# Patient Record
Sex: Male | Born: 2000 | Race: White | Hispanic: No | Marital: Single
Health system: Southern US, Community
[De-identification: ages and names within clinical notes are randomized; demographics above are authoritative.]

---
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
## 2021-01-02 ENCOUNTER — Ambulatory Visit: Payer: Self-pay | Admitting: Dermatology

## 2021-01-11 ENCOUNTER — Encounter: Payer: Self-pay | Admitting: Dermatology

## 2021-01-11 ENCOUNTER — Other Ambulatory Visit: Payer: Self-pay

## 2021-01-11 ENCOUNTER — Ambulatory Visit: Payer: BC Managed Care – PPO | Admitting: Dermatology

## 2021-01-11 DIAGNOSIS — L700 Acne vulgaris: Secondary | ICD-10-CM

## 2021-01-11 NOTE — Progress Notes (Signed)
° °  Follow-Up Visit   Subjective  Tyler Stafford is a 20 y.o. male who presents for the following: Acne (Patient here today for worsening acne. Patient did a course of accutane and is currently only using clindamycin. ).  Patient discontinued isotretinoin due to persistent hip pain after injury in March, total was about 20 weeks of treatment.   The following portions of the chart were reviewed this encounter and updated as appropriate:   Allergies   Meds   Problems   Med Hx   Surg Hx   Fam Hx       Review of Systems:  No other skin or systemic complaints except as noted in HPI or Assessment and Plan.  Objective  Well appearing patient in no apparent distress; mood and affect are within normal limits.  A focused examination was performed including face, neck, chest and back. Relevant physical exam findings are noted in the Assessment and Plan.  face Face with trace open comedones, scattered inflammatory papules favoring cheeks and neck Chest with rare inflammatory papule Back with trace open comedones    Assessment & Plan  Acne vulgaris face  Chronic condition with duration over one year. Condition is bothersome to patient. Currently flared.  Previously on isotretinoin with good response but discontinued earlier in the year due to persistent hip pain after injury.   Pending labs will start Absorica 20 mg once daily  Reviewed potential side effects of isotretinoin including xerosis, cheilitis, hepatitis, hyperlipidemia, and severe birth defects if taken by a pregnant woman. Reviewed reports of suicidal ideation in those with a history of depression while taking isotretinoin and reports of diagnosis of inflammatory bowl disease while taking isotretinoin as well as the lack of evidence for a causal relationship between isotretinoin, depression and IBD. Patient advised to reach out with any questions or concerns. Patient advised not to share pills or donate blood while on treatment or for  one month after completing treatment.  [facility]  Related Procedures Lipid panel Hepatic Function Panel   Return in about 30 days (around 02/10/2021) for Isotretinoin.  Anise Salvo, RMA, am acting as scribe for Darden Dates, MD .  Documentation: I have reviewed the above documentation for accuracy and completeness, and I agree with the above.  Darden Dates, MD

## 2021-01-11 NOTE — Patient Instructions (Addendum)
Reviewed potential side effects of isotretinoin including xerosis, cheilitis, hepatitis, hyperlipidemia, and severe birth defects if taken by a pregnant woman. Reviewed reports of suicidal ideation in those with a history of depression while taking isotretinoin and reports of diagnosis of inflammatory bowl disease while taking isotretinoin as well as the lack of evidence for a causal relationship between isotretinoin, depression and IBD. Patient advised to reach out with any questions or concerns. Patient advised not to share pills or donate blood while on treatment or for one month after completing treatment.  Recommended non-comedogenic (non-acne causing) facial oils include 100% argan oil or squalane. The can be used after applying any recommended creams or ointments to the skin in the evening. [employer] has a high-quality and affordable version of both of these and can be found at [place].  If You Need Anything After Your Visit  If you have any questions or concerns for your doctor, please call our main line at [phone] and press option 4 to reach your doctor's medical assistant. If no one answers, please leave a voicemail as directed and we will return your call as soon as possible. Messages left after 4 pm will be answered the following business day.   You may also send [place] a message via MyChart. We typically respond to MyChart messages within 1-2 business days.  For prescription refills, please ask your pharmacy to contact our office. Our fax number is [phone].  If you have an urgent issue when the clinic is closed that cannot wait until the next business day, you can page your doctor at the number below.    Please note that while we do our best to be available for urgent issues outside of office hours, we are not available 24/7.   If you have an urgent issue and are unable to reach [place], you may choose to seek medical care at your doctor's office, retail clinic, urgent  care center, or emergency room.  If you have a medical emergency, please immediately call 911 or go to the emergency department.  Pager Numbers  - Dr. Gwen Pounds: [phone]  - Dr. Neale Burly: [phone]  - Dr. Roseanne Reno: [phone]  In the event of inclement weather, please call our main line at [phone] for an update on the status of any delays or closures.  Dermatology Medication Tips: Please keep the boxes that topical medications come in in order to help keep track of the instructions about where and how to use these. Pharmacies typically print the medication instructions only on the boxes and not directly on the medication tubes.   If your medication is too expensive, please contact our office at [phone] option 4 or send [place] a message through MyChart.   We are unable to tell what your co-pay for medications will be in advance as this is different depending on your insurance coverage. However, we may be able to find a substitute medication at lower cost or fill out paperwork to get insurance to cover a needed medication.   If a prior authorization is required to get your medication covered by your insurance company, please allow [place] 1-2 business days to complete this process.  Drug prices often vary depending on where the prescription is filled and some pharmacies may offer cheaper prices.  The website www.goodrx.com contains coupons for medications through different pharmacies. The prices here do not account for what the cost may be with help from insurance (it may be cheaper with your insurance), but the website  can give you the price if you did not use any insurance.  - You can print the associated coupon and take it with your prescription to the pharmacy.  - You may also stop by our office during regular business hours and pick up a GoodRx coupon card.  - If you need your prescription sent electronically to a different pharmacy, notify our office through [facility] or  by phone at [phone] option 4.     Si Usted Necesita Algo Despus de Su Visita  Tambin puede enviarnos un mensaje a travs de Pharmacist, community. Por lo general respondemos a los mensajes de MyChart en el transcurso de 1 a 2 das hbiles.  Para renovar recetas, por favor pida a su farmacia que se ponga en contacto con nuestra oficina. Harland Dingwall de fax es [place] [phone].  Si tiene un asunto urgente cuando la clnica est cerrada y que no puede esperar hasta el siguiente da hbil, puede llamar/localizar a su doctor(a) al nmero que aparece a continuacin.   Por favor, tenga en cuenta que aunque hacemos todo lo posible para estar disponibles para asuntos urgentes fuera del horario de [place], no estamos disponibles las 24 horas del da, los 7 das de la [place].   Si tiene un problema urgente y no puede comunicarse con nosotros, puede optar por buscar atencin mdica  en el consultorio de su doctor(a), en una clnica privada, en un centro de atencin urgente o en una sala de emergencias.  Si tiene Engineering geologist, por favor llame inmediatamente al 911 o vaya a la sala de emergencias.  Nmeros de bper  - Dr. Nehemiah Massed: [phone]  - Dra. Moye: [phone]  - Dra. Nicole Kindred: [phone]  En caso de inclemencias del [place], por favor llame a Johnsie Kindred principal al [phone] para una actualizacin sobre el [place] de cualquier retraso o cierre.  Consejos para la medicacin en dermatologa: Por favor, guarde las cajas en las que vienen los medicamentos de uso tpico para ayudarle a seguir las instrucciones sobre dnde y cmo usarlos. Las farmacias generalmente imprimen las instrucciones del medicamento slo en las cajas y no directamente en los tubos del [place].   Si su medicamento es muy caro, por favor, pngase en contacto con Zigmund Daniel llamando al [phone] y presione la opcin 4 o envenos un mensaje a travs de Pharmacist, community.   No podemos decirle cul ser su  copago por los medicamentos por adelantado ya que esto es diferente dependiendo de la cobertura de su seguro. Sin embargo, es posible que podamos encontrar un medicamento sustituto a Electrical engineer un formulario para que el seguro cubra el medicamento que se considera necesario.   Si se requiere una autorizacin previa para que su compaa de seguros [place] su medicamento, por favor permtanos de 1 a 2 das hbiles para completar este proceso.  Los precios de los medicamentos varan con frecuencia dependiendo del Environmental consultant de dnde se surte la receta y alguna farmacias pueden ofrecer precios ms baratos.  El sitio web www.goodrx.com tiene cupones para medicamentos de Airline pilot. Los precios aqu no tienen en cuenta lo que podra costar con la ayuda del seguro (puede ser ms barato con su seguro), pero el sitio web puede darle el precio si no utiliz Research scientist (physical sciences).  - Puede imprimir el cupn correspondiente y llevarlo con su receta a la farmacia.  - Tambin puede pasar por nuestra oficina durante el horario de atencin regular y Charity fundraiser una tarjeta de cupones de GoodRx.  -  Si necesita que su receta se enve electrnicamente a Chiropodist, informe a nuestra oficina a travs de MyChart de [place] o por telfono llamando al [phone] y presione la opcin 4.

## 2021-01-19 LAB — HEPATIC FUNCTION PANEL
ALT: 10 IU/L (ref 0–44)
AST: 22 IU/L (ref 0–40)
Albumin: 4.8 g/dL (ref 4.1–5.2)
Alkaline Phosphatase: 103 IU/L (ref 51–125)
Bilirubin Total: 0.6 mg/dL (ref 0.0–1.2)
Bilirubin, Direct: 0.15 mg/dL (ref 0.00–0.40)
Total Protein: 7.4 g/dL (ref 6.0–8.5)

## 2021-01-19 LAB — LIPID PANEL
Chol/HDL Ratio: 3.3 ratio (ref 0.0–5.0)
Cholesterol, Total: 124 mg/dL (ref 100–169)
HDL: 38 mg/dL — ABNORMAL LOW (ref >39)
LDL Chol Calc (NIH): 67 mg/dL (ref 0–109)
Triglycerides: 100 mg/dL — ABNORMAL HIGH (ref 0–89)
VLDL Cholesterol Cal: 19 mg/dL (ref 5–40)

## 2021-01-24 ENCOUNTER — Telehealth: Payer: Self-pay

## 2021-01-24 DIAGNOSIS — L700 Acne vulgaris: Secondary | ICD-10-CM

## 2021-01-24 MED ORDER — ISOTRETINOIN 20 MG PO CAPS: 20.0000 mg | ORAL_CAPSULE | Freq: Every day | ORAL | 0 refills | Status: DC

## 2021-01-24 NOTE — Telephone Encounter (Addendum)
Patient re-registered on Ipledge.   iPLEDGE REMS ID: [id number] Patient confirmed. Patient's mother informed labs were normal and new rx sent to [facility].   Mother denied questions.   ----- Message from Sandi Mealy, MD sent at 01/24/2021  8:49 AM EST ----- Labs ok, restart isotretinoin

## 2021-03-20 ENCOUNTER — Other Ambulatory Visit: Payer: Self-pay

## 2021-03-20 ENCOUNTER — Ambulatory Visit: Payer: BC Managed Care – PPO | Admitting: Dermatology

## 2021-03-20 VITALS — Wt 145.0 lb

## 2021-03-20 DIAGNOSIS — L723 Sebaceous cyst: Secondary | ICD-10-CM | POA: Diagnosis not present

## 2021-03-20 DIAGNOSIS — L853 Xerosis cutis: Secondary | ICD-10-CM

## 2021-03-20 DIAGNOSIS — L700 Acne vulgaris: Secondary | ICD-10-CM | POA: Diagnosis not present

## 2021-03-20 DIAGNOSIS — K130 Diseases of lips: Secondary | ICD-10-CM

## 2021-03-20 DIAGNOSIS — Z79899 Other long term (current) drug therapy: Secondary | ICD-10-CM

## 2021-03-20 MED ORDER — ISOTRETINOIN 30 MG PO CAPS: 30.0000 mg | ORAL_CAPSULE | Freq: Every day | ORAL | 0 refills | Status: DC

## 2021-03-20 NOTE — Progress Notes (Signed)
? ?Isotretinoin Follow-Up Visit ?  ?Subjective  ?Tyler Stafford is a 21 y.o. male who presents for the following: Acne (Patient here today for 30 day isotretinoin follow up. ). No joint pain. ? ?Patient discontinued isotretinoin due to persistent hip pain after injury in March 22, total was about 20 weeks of treatment. Patient was on 60 mg at time of hip pain.  ? ?Week # 4 ? ?Total mg/kg - 9.13 mg/kg ?iPledge # [id number] ?[facility] ? ? Isotretinoin F/U - 03/20/21 1500   ? ?  ? Isotretinoin Follow Up  ? iPledge # [id number]   ? Date 03/20/21   ? Weight 145 lb (65.8 kg)   ? Acne breakouts since last visit? Yes   Patient with a few small spots and cyst at right eye.  ?  ? Dosage  ? Target Dosage (mg) 9855   ? Current (To Date) Dosage (mg) 600   ? To Go Dosage (mg) 9255   ?  ? Side Effects  ? Skin Chapped Lips;Dry Skin   ? Gastrointestinal WNL   ? Neurological WNL   ? Constitutional WNL   ? ?  ?  ? ?  ? ? ?Side effects: Dry skin, dry lips ? ?Denies changes in night vision, shortness of breath, abdominal pain, nausea, vomiting, diarrhea, blood in stool or urine, visual changes, headaches, epistaxis, joint pain, myalgias, mood changes, depression, or suicidal ideation.  ? ?The following portions of the chart were reviewed this encounter and updated as appropriate: medications, allergies, medical history ? ?Review of Systems:  No other skin or systemic complaints except as noted in HPI or Assessment and Plan. ? ?Objective  ?Well appearing patient in no apparent distress; mood and affect are within normal limits. ? ?An examination of the face, neck, chest, and back was performed and relevant findings are noted below.  ? ?face ?Resolving inflammatory papules at bilateral cheek, jaw ? ?right malar cheek ?1.3 x 0.8 cm firm subcutaneous nodule ? ? ? ?Assessment & Plan  ? ?Acne vulgaris ?face ? 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 ?Patient is on Isotretinoin -  requiring FDA mandated monthly evaluations  and laboratory monitoring. ? ?Week # 4 Absorica ? ?Total mg/kg - 9.13 mg/kg ?iPledge # [id number] ?[facility] ? ?Continue Absorica increasing to 30 mg PO once daily ? ? ? ?ISOtretinoin (ABSORICA) 30 MG capsule - face ?Take 1 capsule (30 mg total) by mouth daily. ? ?Sebaceous cyst ?right malar cheek ? ?Benign-appearing. Exam most consistent with an epidermal inclusion cyst. Discussed that a cyst is a benign growth that can grow over time and sometimes get irritated or inflamed. Recommend observation if it is not bothersome. Discussed option of surgical excision to remove it if it is growing, symptomatic, or other changes noted. Please call for new or changing lesions so they can be evaluated. ? ?Patient advised isotretinoin course may not clear the cyst and it may need to be surgically removed after finished with isotretinoin.  Patient advises it's been there for about 2 months.  ? ? ? ?Xerosis secondary to isotretinoin therapy ?- Continue emollients as directed ? 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 Clayborne Artist Cortibalm recommended ? ?Long term medication management (isotretinoin) ?- While taking Isotretinoin and for 30 days after you finish the medication, do not share pills, do not donate blood. Isotretinoin is best absorbed when taken with a fatty meal. Isotretinoin can make you sensitive to the sun. Daily careful  sun protection including sunscreen SPF 30+ when outdoors is recommended. ? ?Follow-up in 30 days. ? ?Anise Salvo, RMA, am acting as scribe for Willeen Niece, MD . ? ?Documentation: I have reviewed the above documentation for accuracy and completeness, and I agree with the above. ? ?Willeen Niece MD  ? ? ?

## 2021-03-20 NOTE — Patient Instructions (Signed)
Recommend daily broad spectrum sunscreen SPF 30+ to sun-exposed areas, reapply every 2 hours as needed. Call for new or changing lesions.  °Staying in the shade or wearing long sleeves, sun glasses (UVA+UVB protection) and wide brim hats (4-inch brim around the entire circumference of the hat) are also recommended for sun protection.  ° ° ° °If You Need Anything After Your Visit ° °If you have any questions or concerns for your doctor, please call our main line at [phone] and press option 4 to reach your doctor's medical assistant. If no one answers, please leave a voicemail as directed and we will return your call as soon as possible. Messages left after 4 pm will be answered the following business day.  ° °You may also send us a message via MyChart. We typically respond to MyChart messages within 1-2 business days. ° °For prescription refills, please ask your pharmacy to contact our office. Our fax number is [phone]. ° °If you have an urgent issue when the clinic is closed that cannot wait until the next business day, you can page your doctor at the number below.   ° °Please note that while we do our best to be available for urgent issues outside of office hours, we are not available 24/7.  ° °If you have an urgent issue and are unable to reach us, you may choose to seek medical care at your doctor's office, retail clinic, urgent care center, or emergency room. ° °If you have a medical emergency, please immediately call 911 or go to the emergency department. ° °Pager Numbers ° °- Dr. Kowalski: [phone] ° °- Dr. Moye: [phone] ° °- Dr. Stewart: [phone] ° °In the event of inclement weather, please call our main line at [phone] for an update on the status of any delays or closures. ° °Dermatology Medication Tips: °Please keep the boxes that topical medications come in in order to help keep track of the instructions about where and how to use these. Pharmacies typically print the medication  instructions only on the boxes and not directly on the medication tubes.  ° °If your medication is too expensive, please contact our office at [phone] option 4 or send us a message through MyChart.  ° °We are unable to tell what your co-pay for medications will be in advance as this is different depending on your insurance coverage. However, we may be able to find a substitute medication at lower cost or fill out paperwork to get insurance to cover a needed medication.  ° °If a prior authorization is required to get your medication covered by your insurance company, please allow us 1-2 business days to complete this process. ° °Drug prices often vary depending on where the prescription is filled and some pharmacies may offer cheaper prices. ° °The website www.goodrx.com contains coupons for medications through different pharmacies. The prices here do not account for what the cost may be with help from insurance (it may be cheaper with your insurance), but the website can give you the price if you did not use any insurance.  °- You can print the associated coupon and take it with your prescription to the pharmacy.  °- You may also stop by our office during regular business hours and pick up a GoodRx coupon card.  °- If you need your prescription sent electronically to a different pharmacy, notify our office through [facility] MyChart or by phone at [phone] option 4. ° ° ° ° °Si Usted Necesita Algo Después de   Su Visita ° °También puede enviarnos un mensaje a través de MyChart. Por lo general respondemos a los mensajes de MyChart en el transcurso de 1 a 2 días hábiles. ° °Para renovar recetas, por favor pida a su farmacia que se ponga en contacto con nuestra oficina. Nuestro número de fax es el [phone]. ° °Si tiene un asunto urgente cuando la clínica esté cerrada y que no puede esperar hasta el siguiente día hábil, puede llamar/localizar a su doctor(a) al número que aparece a continuación.  ° °Por  favor, tenga en cuenta que aunque hacemos todo lo posible para estar disponibles para asuntos urgentes fuera del horario de oficina, no estamos disponibles las 24 horas del día, los 7 días de la semana.  ° °Si tiene un problema urgente y no puede comunicarse con nosotros, puede optar por buscar atención médica  en el consultorio de su doctor(a), en una clínica privada, en un centro de atención urgente o en una sala de emergencias. ° °Si tiene una emergencia médica, por favor llame inmediatamente al 911 o vaya a la sala de emergencias. ° °Números de bíper ° °- Dr. Kowalski: [phone] ° °- Dra. Moye: [phone] ° °- Dra. Stewart: [phone] ° °En caso de inclemencias del tiempo, por favor llame a nuestra línea principal al [phone] para una actualización sobre el estado de cualquier retraso o cierre. ° °Consejos para la medicación en dermatología: °Por favor, guarde las cajas en las que vienen los medicamentos de uso tópico para ayudarle a seguir las instrucciones sobre dónde y cómo usarlos. Las farmacias generalmente imprimen las instrucciones del medicamento sólo en las cajas y no directamente en los tubos del medicamento.  ° °Si su medicamento es muy caro, por favor, póngase en contacto con nuestra oficina llamando al [phone] y presione la opción 4 o envíenos un mensaje a través de MyChart.  ° °No podemos decirle cuál será su copago por los medicamentos por adelantado ya que esto es diferente dependiendo de la cobertura de su seguro. Sin embargo, es posible que podamos encontrar un medicamento sustituto a menor costo o llenar un formulario para que el seguro cubra el medicamento que se considera necesario.  ° °Si se requiere una autorización previa para que su compañía de seguros cubra su medicamento, por favor permítanos de 1 a 2 días hábiles para completar este proceso. ° °Los precios de los medicamentos varían con frecuencia dependiendo del lugar de dónde se surte la receta y alguna farmacias  pueden ofrecer precios más baratos. ° °El sitio web www.goodrx.com tiene cupones para medicamentos de diferentes farmacias. Los precios aquí no tienen en cuenta lo que podría costar con la ayuda del seguro (puede ser más barato con su seguro), pero el sitio web puede darle el precio si no utilizó ningún seguro.  °- Puede imprimir el cupón correspondiente y llevarlo con su receta a la farmacia.  °- También puede pasar por nuestra oficina durante el horario de atención regular y recoger una tarjeta de cupones de GoodRx.  °- Si necesita que su receta se envíe electrónicamente a una farmacia diferente, informe a nuestra oficina a través de MyChart de [place] o por teléfono llamando al [phone] y presione la opción 4. ° °

## 2021-03-21 ENCOUNTER — Ambulatory Visit: Payer: BC Managed Care – PPO | Admitting: Dermatology

## 2021-04-19 ENCOUNTER — Ambulatory Visit: Payer: BC Managed Care – PPO | Admitting: Dermatology

## 2021-05-17 ENCOUNTER — Ambulatory Visit: Payer: BC Managed Care – PPO | Admitting: Dermatology

## 2021-05-17 VITALS — Wt 145.0 lb

## 2021-05-17 DIAGNOSIS — L700 Acne vulgaris: Secondary | ICD-10-CM | POA: Diagnosis not present

## 2021-05-17 DIAGNOSIS — L853 Xerosis cutis: Secondary | ICD-10-CM

## 2021-05-17 DIAGNOSIS — Z79899 Other long term (current) drug therapy: Secondary | ICD-10-CM | POA: Diagnosis not present

## 2021-05-17 DIAGNOSIS — K130 Diseases of lips: Secondary | ICD-10-CM

## 2021-05-17 MED ORDER — ISOTRETINOIN 40 MG PO CAPS: 40.0000 mg | ORAL_CAPSULE | Freq: Every day | ORAL | 0 refills | Status: DC

## 2021-05-17 NOTE — Progress Notes (Signed)
? ?  Isotretinoin Follow-Up Visit ?  ?Subjective  ?Tyler Stafford is a 21 y.o. male who presents for the following: Acne (Face, 79m f/u Isotretinoin, Wk 8, Isotretinoin 30mg  1 po qd). ? ?Week # 8 ? ? Isotretinoin F/U - 05/17/21 1500   ? ?  ? Isotretinoin Follow Up  ? iPledge # 07/17/21   ? Date 05/17/21   ? Weight 145 lb (65.8 kg)   ? Acne breakouts since last visit? No   ?  ? Dosage  ? Target Dosage (mg) 9855   ? Current (To Date) Dosage (mg) 1500   ? To Go Dosage (mg) 8355   ?  ? Side Effects  ? Skin Dry Lips;Eye Irritation;Dry Nose   ? Gastrointestinal WNL   ? Neurological WNL   ? Constitutional WNL   ? ?  ?  ? ?  ? ?Side effects: Dry skin, dry lips ? ?Denies changes in night vision, shortness of breath, abdominal pain, nausea, vomiting, diarrhea, blood in stool or urine, visual changes, headaches, epistaxis, joint pain, myalgias, mood changes, depression, or suicidal ideation.  ? ?The following portions of the chart were reviewed this encounter and updated as appropriate: medications, allergies, medical history ? ?Review of Systems:  No other skin or systemic complaints except as noted in HPI or Assessment and Plan. ? ?Objective  ?Well appearing patient in no apparent distress; mood and affect are within normal limits. ? ?An examination of the face, neck, chest, and back was performed and relevant findings are noted below.  ? ?face ?Few active areas face, pink macules face ? ? ?Assessment & Plan  ? ?Acne vulgaris ?face ?Pt previously on Isotretinoin for 16 wks, he d/c due to soccer injury not healing, restarted 12/22 ? ?Week # 8 Absorica ?IPledge # 1/23 ?[facility] ?Total mg = 1,500mg  ?Total mg/kg = 22.8mg /kg ? ?Increase to Absorica 40mg  1 po qd #30 0rf ?Reregistered/confirmed pt in [facility] program ? 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 ?Patient is on Isotretinoin -  requiring FDA mandated monthly evaluations and laboratory monitoring. ? ?While taking isotretinoin, do not share pills  and do not donate blood. Generic isotretinoin is best absorbed when taken with a fatty meal. Isotretinoin can make you sensitive to the sun. Daily careful sun protection including sunscreen SPF 30+ when outdoors is recommended.  ? ?ISOtretinoin (ABSORICA) 40 MG capsule - face ?Take 1 capsule (40 mg total) by mouth daily. ? ?Related Medications ?ISOtretinoin (ABSORICA) 30 MG capsule ?Take 1 capsule (30 mg total) by mouth daily. ? ?Xerosis secondary to isotretinoin therapy ?- Continue emollients as directed ? 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 03-12-2001 Cortibalm recommended ? ?Long term medication management (isotretinoin) ?- While taking Isotretinoin and for 30 days after you finish the medication, do not share pills, do not donate blood. Isotretinoin is best absorbed when taken with a fatty meal. Isotretinoin can make you sensitive to the sun. Daily careful sun protection including sunscreen SPF 30+ when outdoors is recommended. ? ?Follow-up in 30 days. ? ?I, [facility], RMA, am acting as scribe for Clayborne Artist, MD . ?Documentation: I have reviewed the above documentation for accuracy and completeness, and I agree with the above. ? ?Ardis Rowan, MD ? ? ?

## 2021-05-17 NOTE — Patient Instructions (Signed)

## 2021-05-29 ENCOUNTER — Encounter: Payer: Self-pay | Admitting: Dermatology

## 2021-06-25 ENCOUNTER — Ambulatory Visit: Payer: BC Managed Care – PPO | Admitting: Dermatology

## 2021-06-25 VITALS — Wt 145.0 lb

## 2021-06-25 DIAGNOSIS — L700 Acne vulgaris: Secondary | ICD-10-CM | POA: Diagnosis not present

## 2021-06-25 DIAGNOSIS — L853 Xerosis cutis: Secondary | ICD-10-CM

## 2021-06-25 DIAGNOSIS — Z79899 Other long term (current) drug therapy: Secondary | ICD-10-CM

## 2021-06-25 DIAGNOSIS — K130 Diseases of lips: Secondary | ICD-10-CM

## 2021-06-25 MED ORDER — ISOTRETINOIN 30 MG PO CAPS: ORAL_CAPSULE | ORAL | 0 refills | Status: DC

## 2021-06-25 MED ORDER — TACROLIMUS 0.1 % EX OINT: TOPICAL_OINTMENT | CUTANEOUS | 0 refills | Status: DC

## 2021-06-25 NOTE — Progress Notes (Signed)
   Isotretinoin Follow-Up Visit   Subjective  Tyler Stafford is a 21 y.o. male who presents for the following: Acne.  Week # 12   Isotretinoin F/U - 06/25/21 1600       Isotretinoin Follow Up   iPledge # [id number]    Date 06/25/21    Weight 145 lb (65.8 kg)    Acne breakouts since last visit? Yes      Dosage   Target Dosage (mg) 9855    Current (To Date) Dosage (mg) 2700    To Go Dosage (mg) 7155      Side Effects   Skin Chapped Lips;Dry Lips;Dry Skin    Gastrointestinal WNL    Neurological WNL    Constitutional WNL            Side effects: Dry skin, dry lips  Denies changes in night vision, shortness of breath, abdominal pain, nausea, vomiting, diarrhea, blood in stool or urine, visual changes, headaches, epistaxis, joint pain, myalgias, mood changes, depression, or suicidal ideation.   The following portions of the chart were reviewed this encounter and updated as appropriate: medications, allergies, medical history  Review of Systems:  No other skin or systemic complaints except as noted in HPI or Assessment and Plan.  Objective  Well appearing patient in no apparent distress; mood and affect are within normal limits.  An examination of the face, neck, chest, and back was performed and relevant findings are noted below.   Face, chest, back Few active papules on the chest and back.    Assessment & Plan   Acne vulgaris Face, chest, back  Acne is Severe; chronic and persistent; not at goal. Patient is on Isotretinoin -  requiring FDA mandated monthly evaluations and laboratory monitoring.  Previously on isotretinoin with good response but discontinued earlier in the year due to persistent hip pain after injury. Previous mg/kg total dose 50.2mg /kg.  While taking isotretinoin, do not share pills and do not donate blood. Generic isotretinoin is best absorbed when taken with a fatty meal. Isotretinoin can make you sensitive to the sun. Daily careful sun  protection including sunscreen SPF 30+ when outdoors is recommended.  IPledge # [id number] [facility] Total mg = 2700mg  Total mg/kg = 41.0mg /kg  Increase Isotretinoin to 30mg  2 po QD.  Related Medications ISOtretinoin (ABSORICA) 30 MG capsule Take 2 caps po QD.  Cheilitis Lips  Start Tacrolimus to aa's BID.   tacrolimus (PROTOPIC) 0.1 % ointment - Lips Apply to aa's lips BID PRN.   Xerosis secondary to isotretinoin therapy - Continue emollients as directed  Long term medication management (isotretinoin) - While taking Isotretinoin and for 30 days after you finish the medication, do not share pills, do not donate blood. Isotretinoin is best absorbed when taken with a fatty meal. Isotretinoin can make you sensitive to the sun. Daily careful sun protection including sunscreen SPF 30+ when outdoors is recommended.  Follow-up in 30 days.  , CMA, am acting as scribe for , MD . Documentation: I have reviewed the above documentation for accuracy and completeness, and I agree with the above.  Maylene Roes, MD

## 2021-06-26 ENCOUNTER — Encounter: Payer: Self-pay | Admitting: Dermatology

## 2021-08-01 ENCOUNTER — Other Ambulatory Visit: Payer: Self-pay | Admitting: Dermatology

## 2021-08-01 DIAGNOSIS — K130 Diseases of lips: Secondary | ICD-10-CM

## 2021-08-08 ENCOUNTER — Ambulatory Visit: Payer: BC Managed Care – PPO | Admitting: Dermatology

## 2021-08-08 VITALS — Wt 145.0 lb

## 2021-08-08 DIAGNOSIS — L700 Acne vulgaris: Secondary | ICD-10-CM

## 2021-08-08 MED ORDER — ISOTRETINOIN 30 MG PO CAPS: ORAL_CAPSULE | ORAL | 0 refills | Status: AC

## 2021-08-08 NOTE — Patient Instructions (Signed)
Due to recent changes in healthcare laws, you may see results of your pathology and/or laboratory studies on MyChart before the doctors have had a chance to review them. We understand that in some cases there may be results that are confusing or concerning to you. Please understand that not all results are received at the same time and often the doctors may need to interpret multiple results in order to provide you with the best plan of care or course of treatment. Therefore, we ask that you please give us 2 business days to thoroughly review all your results before contacting the office for clarification. Should we see a critical lab result, you will be contacted sooner.   If You Need Anything After Your Visit  If you have any questions or concerns for your doctor, please call our main line at [phone] and press option 4 to reach your doctor's medical assistant. If no one answers, please leave a voicemail as directed and we will return your call as soon as possible. Messages left after 4 pm will be answered the following business day.   You may also send us a message via MyChart. We typically respond to MyChart messages within 1-2 business days.  For prescription refills, please ask your pharmacy to contact our office. Our fax number is [phone].  If you have an urgent issue when the clinic is closed that cannot wait until the next business day, you can page your doctor at the number below.    Please note that while we do our best to be available for urgent issues outside of office hours, we are not available 24/7.   If you have an urgent issue and are unable to reach us, you may choose to seek medical care at your doctor's office, retail clinic, urgent care center, or emergency room.  If you have a medical emergency, please immediately call 911 or go to the emergency department.  Pager Numbers  - Dr. Kowalski: [phone]  - Dr. Moye: [phone]  - Dr. Stewart:  [phone]  In the event of inclement weather, please call our main line at [phone] for an update on the status of any delays or closures.  Dermatology Medication Tips: Please keep the boxes that topical medications come in in order to help keep track of the instructions about where and how to use these. Pharmacies typically print the medication instructions only on the boxes and not directly on the medication tubes.   If your medication is too expensive, please contact our office at [phone] option 4 or send us a message through MyChart.   We are unable to tell what your co-pay for medications will be in advance as this is different depending on your insurance coverage. However, we may be able to find a substitute medication at lower cost or fill out paperwork to get insurance to cover a needed medication.   If a prior authorization is required to get your medication covered by your insurance company, please allow us 1-2 business days to complete this process.  Drug prices often vary depending on where the prescription is filled and some pharmacies may offer cheaper prices.  The website www.goodrx.com contains coupons for medications through different pharmacies. The prices here do not account for what the cost may be with help from insurance (it may be cheaper with your insurance), but the website can give you the price if you did not use any insurance.  - You can print the associated coupon and take it with   your prescription to the pharmacy.  - You may also stop by our office during regular business hours and pick up a GoodRx coupon card.  - If you need your prescription sent electronically to a different pharmacy, notify our office through [facility] MyChart or by phone at [phone] option 4.     Si Usted Necesita Algo Despus de Su Visita  Tambin puede enviarnos un mensaje a travs de MyChart. Por lo general respondemos a los mensajes de MyChart en el transcurso de 1 a 2  das hbiles.  Para renovar recetas, por favor pida a su farmacia que se ponga en contacto con nuestra oficina. Nuestro nmero de fax es el [phone].  Si tiene un asunto urgente cuando la clnica est cerrada y que no puede esperar hasta el siguiente da hbil, puede llamar/localizar a su doctor(a) al nmero que aparece a continuacin.   Por favor, tenga en cuenta que aunque hacemos todo lo posible para estar disponibles para asuntos urgentes fuera del horario de oficina, no estamos disponibles las 24 horas del da, los 7 das de la semana.   Si tiene un problema urgente y no puede comunicarse con nosotros, puede optar por buscar atencin mdica  en el consultorio de su doctor(a), en una clnica privada, en un centro de atencin urgente o en una sala de emergencias.  Si tiene una emergencia mdica, por favor llame inmediatamente al 911 o vaya a la sala de emergencias.  Nmeros de bper  - Dr. Kowalski: [phone]  - Dra. Moye: [phone]  - Dra. Stewart: [phone]  En caso de inclemencias del tiempo, por favor llame a nuestra lnea principal al [phone] para una actualizacin sobre el estado de cualquier retraso o cierre.  Consejos para la medicacin en dermatologa: Por favor, guarde las cajas en las que vienen los medicamentos de uso tpico para ayudarle a seguir las instrucciones sobre dnde y cmo usarlos. Las farmacias generalmente imprimen las instrucciones del medicamento slo en las cajas y no directamente en los tubos del medicamento.   Si su medicamento es muy caro, por favor, pngase en contacto con nuestra oficina llamando al [phone] y presione la opcin 4 o envenos un mensaje a travs de MyChart.   No podemos decirle cul ser su copago por los medicamentos por adelantado ya que esto es diferente dependiendo de la cobertura de su seguro. Sin embargo, es posible que podamos encontrar un medicamento sustituto a menor costo o llenar un formulario para que el  seguro cubra el medicamento que se considera necesario.   Si se requiere una autorizacin previa para que su compaa de seguros cubra su medicamento, por favor permtanos de 1 a 2 das hbiles para completar este proceso.  Los precios de los medicamentos varan con frecuencia dependiendo del lugar de dnde se surte la receta y alguna farmacias pueden ofrecer precios ms baratos.  El sitio web www.goodrx.com tiene cupones para medicamentos de diferentes farmacias. Los precios aqu no tienen en cuenta lo que podra costar con la ayuda del seguro (puede ser ms barato con su seguro), pero el sitio web puede darle el precio si no utiliz ningn seguro.  - Puede imprimir el cupn correspondiente y llevarlo con su receta a la farmacia.  - Tambin puede pasar por nuestra oficina durante el horario de atencin regular y recoger una tarjeta de cupones de GoodRx.  - Si necesita que su receta se enve electrnicamente a una farmacia diferente, informe a nuestra oficina a travs de MyChart de [place]   o por telfono llamando al [phone] y presione la opcin 4.  

## 2021-08-08 NOTE — Progress Notes (Signed)
   New Patient Visit  Subjective  Tyler Stafford is a 21 y.o. male who presents for the following: Acne (Face, chest, back, Isotretinoin wk 16 f/u, Isotretinoin 30mg  2 po qd, only issue with increase on 60mg  is joint aches in hip).  The following portions of the chart were reviewed this encounter and updated as appropriate:   Allergies  Meds  Problems  Med Hx  Surg Hx  Fam Hx     Review of Systems:  No other skin or systemic complaints except as noted in HPI or Assessment and Plan.  Objective  Well appearing patient in no apparent distress; mood and affect are within normal limits.  A focused examination was performed including face, chest, back. Relevant physical exam findings are noted in the Assessment and Plan.  face, chest, back Face, clear today   Assessment & Plan  Acne vulgaris face, chest, back  Acne is Severe; chronic and persistent; not at goal. Patient is on Isotretinoin -  requiring FDA mandated monthly evaluations and laboratory monitoring.   Previously on isotretinoin with good response but discontinued earlier in the year due to persistent hip pain after injury. Previous mg/kg total dose ~82mg /kg for 20wk course.   While taking isotretinoin, do not share pills and do not donate blood. Generic isotretinoin is best absorbed when taken with a fatty meal. Isotretinoin can make you sensitive to the sun. Daily careful sun protection including sunscreen SPF 30+ when outdoors is recommended.   Labs from 01/18/21 viewed IPledge # [facility] Total mg = 4500mg  Total mg/kg = 68.4mg /kg (this course) Total mg/kg both courses combined = 150.4mg /kg  Discussed decreasing dosing due to joint ache, pt does not want to decrease Cont Isotretinoin 30mg  2 po qd with fatty meal Pt confirmed in [employer] program  Related Medications ISOtretinoin (ABSORICA) 30 MG capsule Take 2 caps po QD with fatty meal   Return in about 1 month (around 09/08/2021) for  Isotretinoin f/u.  I, [facility], RMA, am acting as scribe for , MD . Documentation: I have reviewed the above documentation for accuracy and completeness, and I agree with the above.  , MD

## 2021-08-12 ENCOUNTER — Encounter: Payer: Self-pay | Admitting: Dermatology

## 2021-09-12 ENCOUNTER — Ambulatory Visit: Payer: BC Managed Care – PPO | Admitting: Dermatology
